# Patient Record
Sex: Female | Born: 1980 | Race: Black or African American | Hispanic: No | Marital: Married | Smoking: Current some day smoker
Health system: Southern US, Community
[De-identification: ages and names within clinical notes are randomized; demographics above are authoritative.]

## PROBLEM LIST (undated history)

## (undated) DIAGNOSIS — H409 Unspecified glaucoma: Secondary | ICD-10-CM

## (undated) DIAGNOSIS — G43909 Migraine, unspecified, not intractable, without status migrainosus: Secondary | ICD-10-CM

## (undated) DIAGNOSIS — J45909 Unspecified asthma, uncomplicated: Secondary | ICD-10-CM

## (undated) HISTORY — DX: Unspecified glaucoma: H40.9

## (undated) HISTORY — DX: Migraine, unspecified, not intractable, without status migrainosus: G43.909

## (undated) HISTORY — PX: TUBAL LIGATION: SHX77

## (undated) HISTORY — DX: Unspecified asthma, uncomplicated: J45.909

---

## 2010-05-17 ENCOUNTER — Emergency Department (HOSPITAL_COMMUNITY): Admission: EM | Admit: 2010-05-17 | Discharge: 2010-05-18 | Payer: Self-pay | Admitting: Emergency Medicine

## 2010-10-30 LAB — URINALYSIS, ROUTINE W REFLEX MICROSCOPIC
Bilirubin Urine: NEGATIVE
Glucose, UA: NEGATIVE mg/dL
Ketones, ur: NEGATIVE mg/dL
Nitrite: NEGATIVE
Protein, ur: NEGATIVE mg/dL
pH: 6 (ref 5.0–8.0)

## 2010-10-30 LAB — POCT PREGNANCY, URINE: Preg Test, Ur: NEGATIVE

## 2011-04-14 ENCOUNTER — Emergency Department (HOSPITAL_COMMUNITY)
Admission: EM | Admit: 2011-04-14 | Discharge: 2011-04-15 | Disposition: A | Discharge status: Home / Self Care | Payer: Self-pay | Attending: Emergency Medicine | Admitting: Emergency Medicine

## 2011-04-14 DIAGNOSIS — M542 Cervicalgia: Secondary | ICD-10-CM | POA: Insufficient documentation

## 2011-04-14 DIAGNOSIS — S1096XA Insect bite of unspecified part of neck, initial encounter: Secondary | ICD-10-CM | POA: Insufficient documentation

## 2011-04-14 DIAGNOSIS — W57XXXA Bitten or stung by nonvenomous insect and other nonvenomous arthropods, initial encounter: Secondary | ICD-10-CM | POA: Insufficient documentation

## 2011-04-14 DIAGNOSIS — R209 Unspecified disturbances of skin sensation: Secondary | ICD-10-CM | POA: Insufficient documentation

## 2011-11-03 ENCOUNTER — Ambulatory Visit: Payer: Self-pay

## 2011-11-10 IMAGING — CR DG HAND COMPLETE 3+V*R*
3 series · 3 of 3 positions shown · non-contrast
Comparison: None

CLINICAL DATA: Right hand laceration, abrasions, pain, trauma

RIGHT HAND - COMPLETE 3+ VIEW

[x hand ap right]
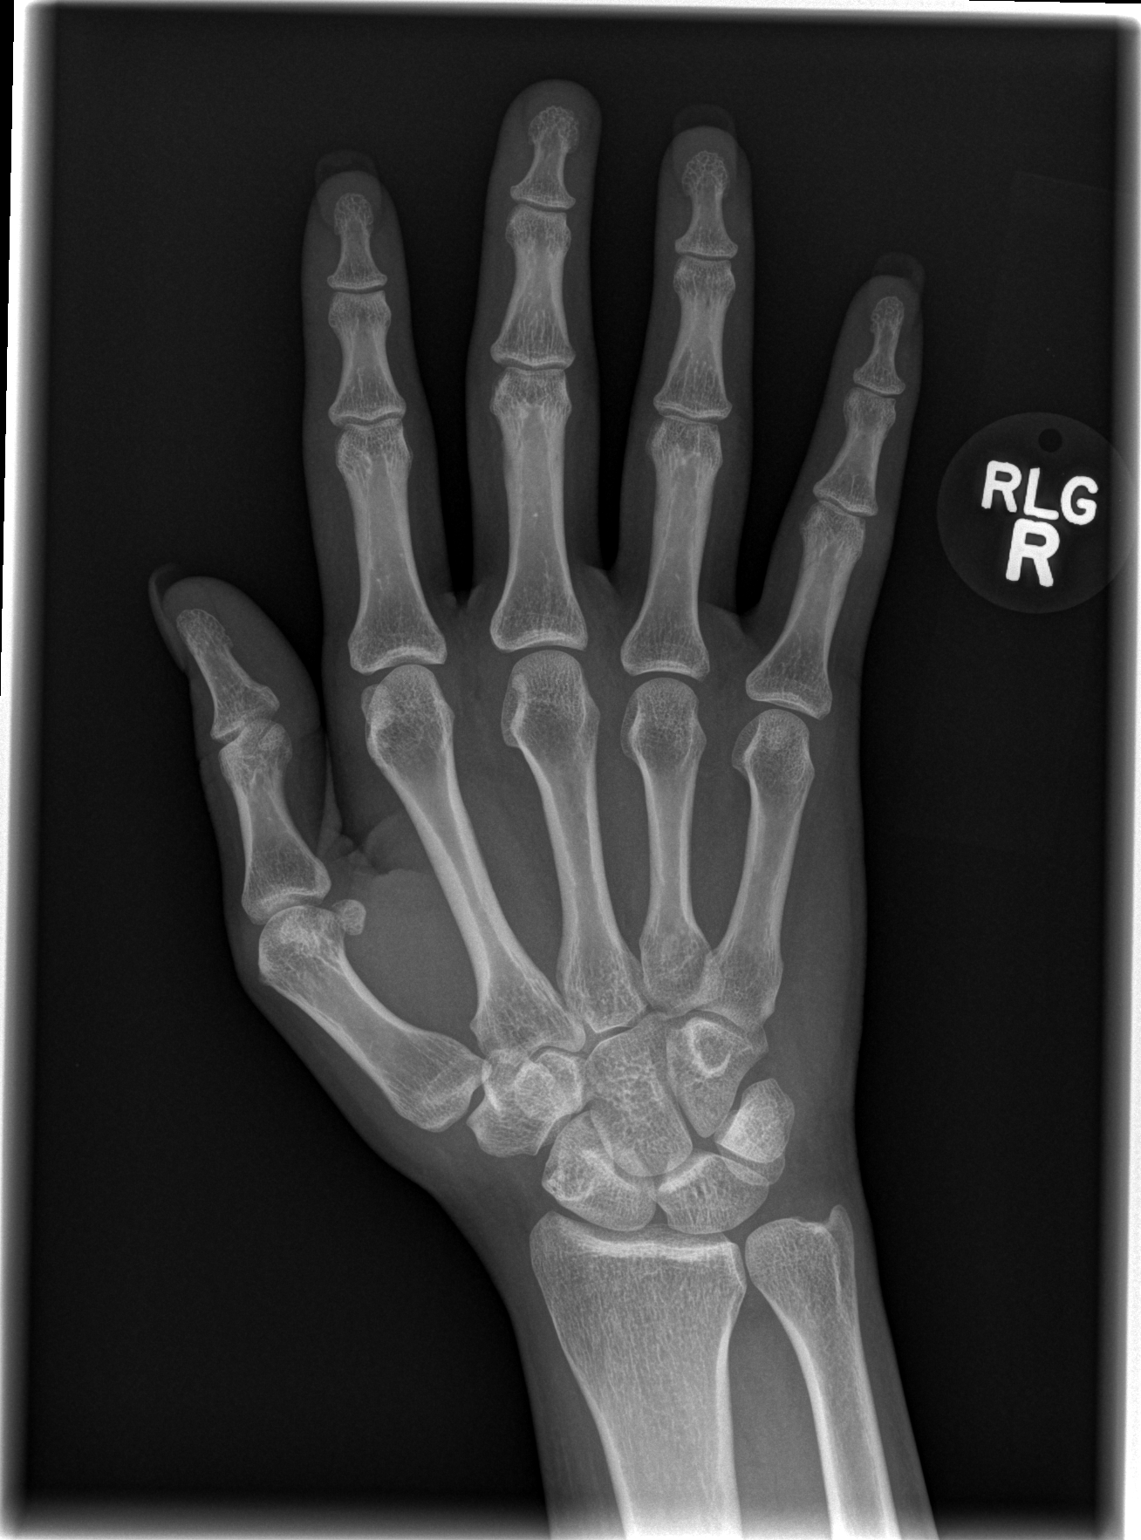

[x hand oblique right]
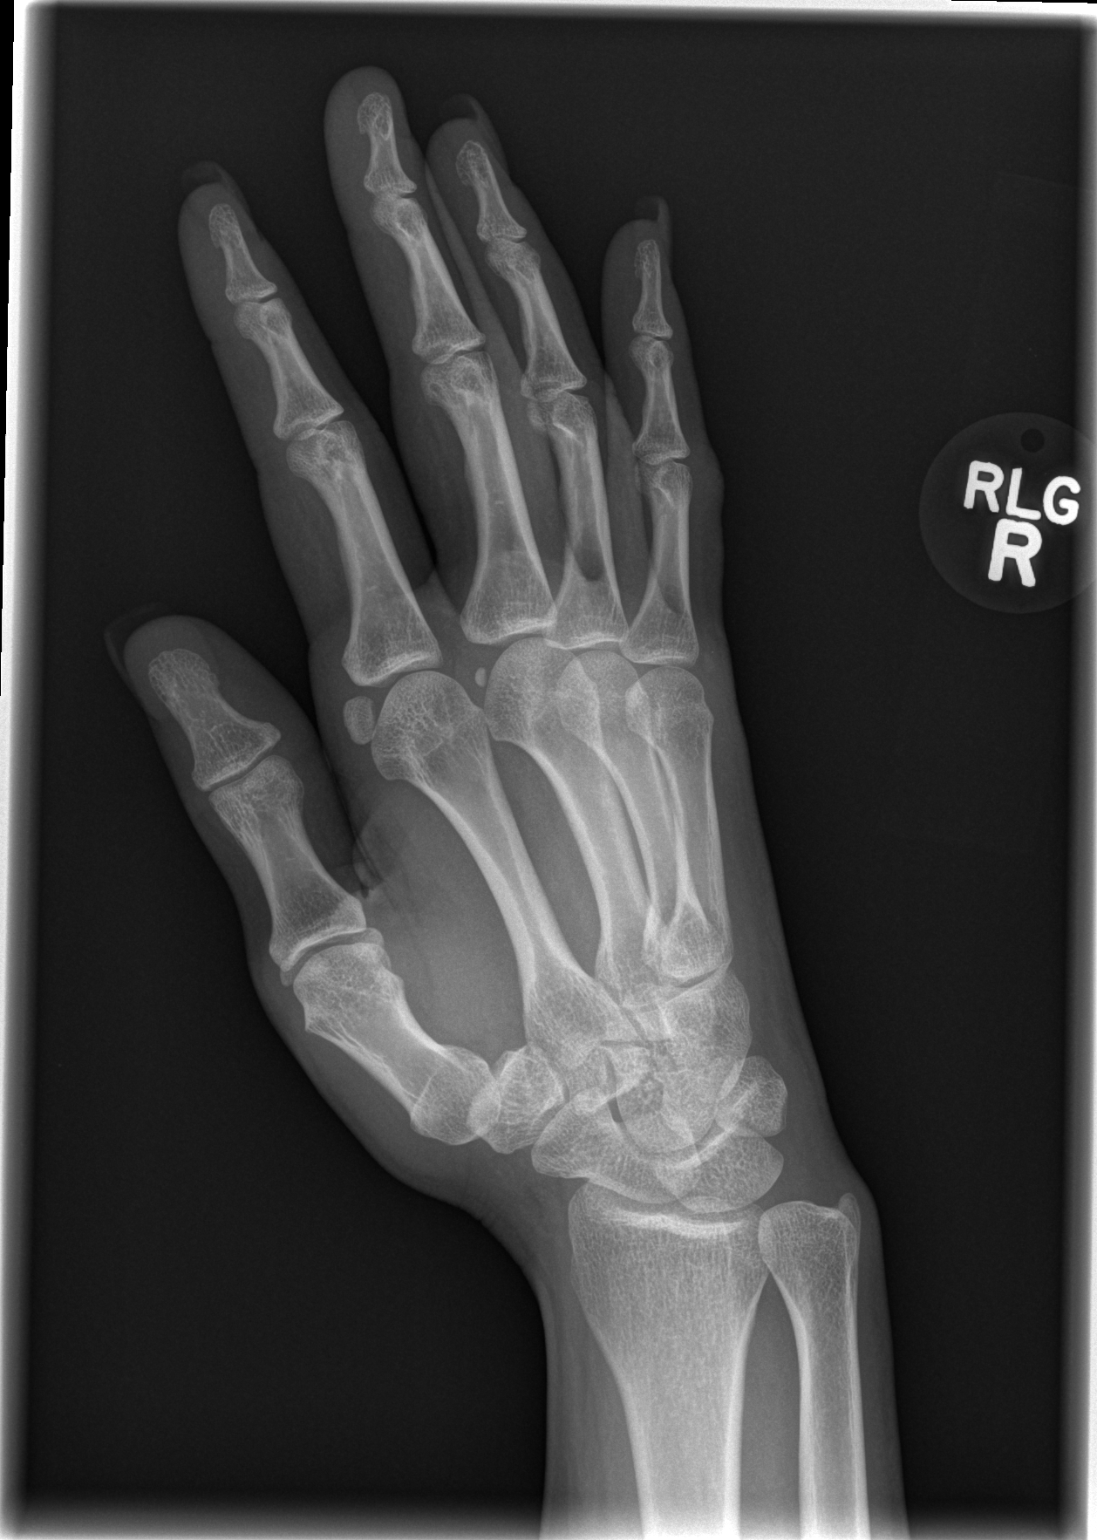

[x hand lat right]
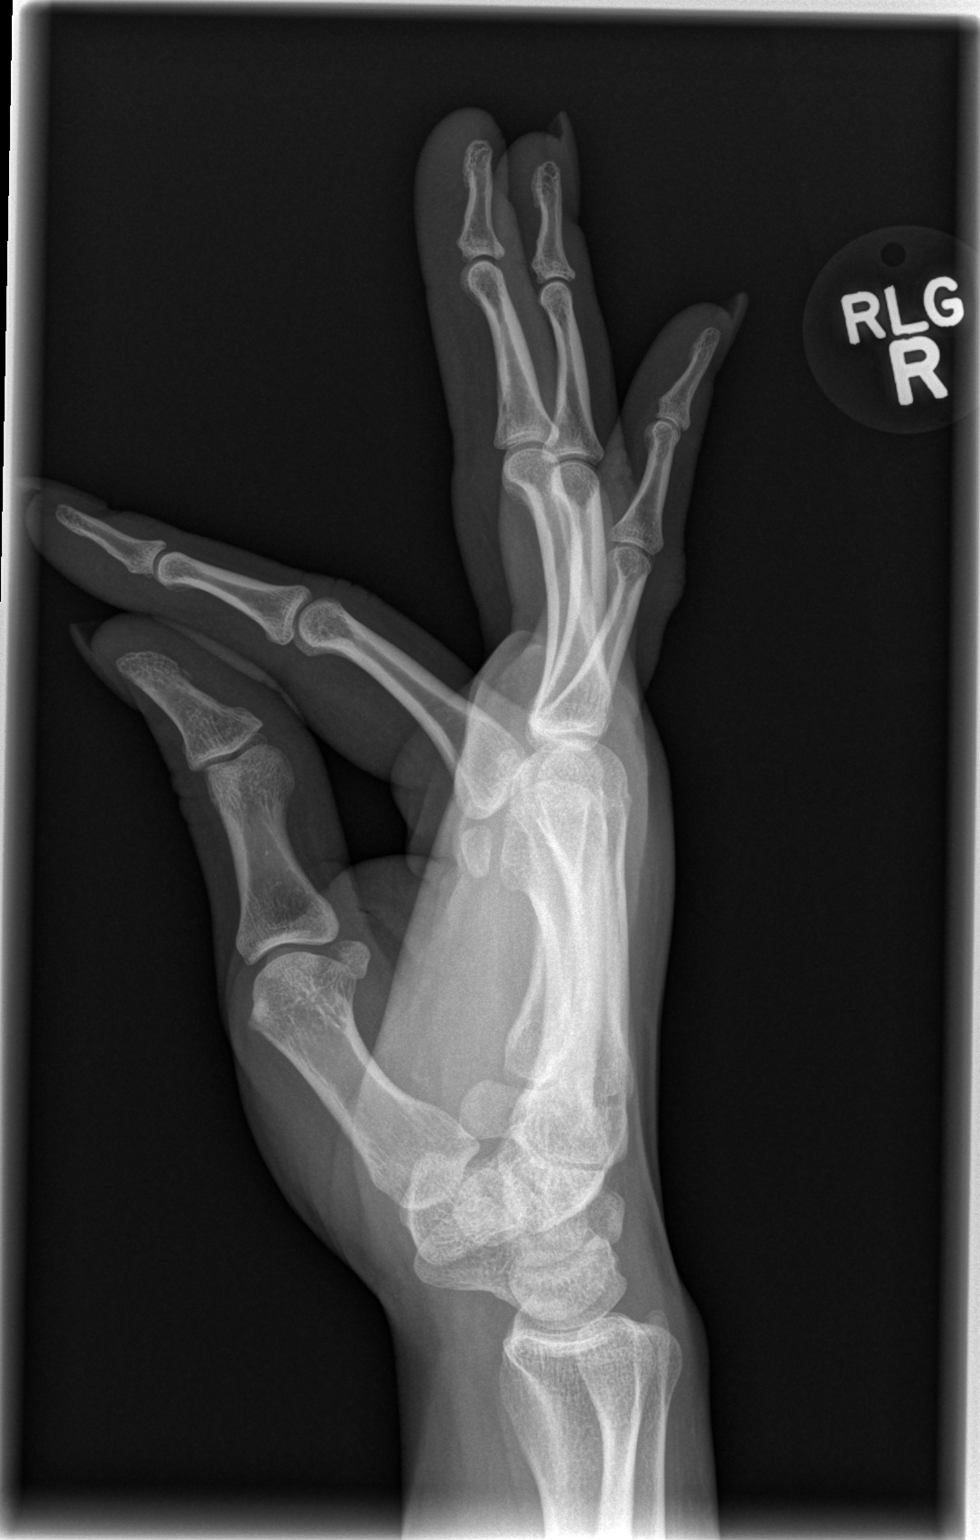

[3 of 3 positions shown; findings below may reference images not displayed]

FINDINGS: Bone mineralization normal.
Joint spaces preserved.
No fracture, dislocation, or bone destruction.
IMPRESSION: No acute osseous abnormalities.

## 2013-01-06 ENCOUNTER — Ambulatory Visit: Payer: BC Managed Care – PPO

## 2013-01-06 ENCOUNTER — Ambulatory Visit (INDEPENDENT_AMBULATORY_CARE_PROVIDER_SITE_OTHER): Payer: BC Managed Care – PPO | Admitting: Family Medicine

## 2013-01-06 VITALS — BP 118/74 | HR 86 | Temp 98.5°F | Resp 16 | Ht 70.0 in | Wt 171.0 lb

## 2013-01-06 DIAGNOSIS — S0993XA Unspecified injury of face, initial encounter: Secondary | ICD-10-CM

## 2013-01-06 DIAGNOSIS — S199XXA Unspecified injury of neck, initial encounter: Secondary | ICD-10-CM

## 2013-01-06 DIAGNOSIS — S0011XA Contusion of right eyelid and periocular area, initial encounter: Secondary | ICD-10-CM

## 2013-01-06 DIAGNOSIS — S0510XA Contusion of eyeball and orbital tissues, unspecified eye, initial encounter: Secondary | ICD-10-CM
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
## 2013-01-06 NOTE — Patient Instructions (Addendum)
If your vision gets worse or changes seek care right away.  Let us know if your pain is not getting better over the next few days

## 2013-08-27 ENCOUNTER — Emergency Department (HOSPITAL_COMMUNITY)
Admission: EM | Admit: 2013-08-27 | Discharge: 2013-08-27 | Disposition: A | Discharge status: Home / Self Care | Payer: BC Managed Care – PPO | Attending: Emergency Medicine | Admitting: Emergency Medicine

## 2013-08-27 ENCOUNTER — Encounter (HOSPITAL_COMMUNITY): Payer: Self-pay | Admitting: Emergency Medicine

## 2013-08-27 ENCOUNTER — Emergency Department (HOSPITAL_COMMUNITY): Payer: BC Managed Care – PPO

## 2013-08-27 DIAGNOSIS — S1093XA Contusion of unspecified part of neck, initial encounter: Principal | ICD-10-CM

## 2013-08-27 DIAGNOSIS — T07XXXA Unspecified multiple injuries, initial encounter: Secondary | ICD-10-CM

## 2013-08-27 DIAGNOSIS — S0003XA Contusion of scalp, initial encounter: Secondary | ICD-10-CM | POA: Insufficient documentation

## 2013-08-27 DIAGNOSIS — S40029A Contusion of unspecified upper arm, initial encounter: Secondary | ICD-10-CM | POA: Insufficient documentation

## 2013-08-27 DIAGNOSIS — F1010 Alcohol abuse, uncomplicated: Secondary | ICD-10-CM | POA: Insufficient documentation

## 2013-08-27 DIAGNOSIS — H409 Unspecified glaucoma: Secondary | ICD-10-CM | POA: Insufficient documentation

## 2013-08-27 DIAGNOSIS — J45909 Unspecified asthma, uncomplicated: Secondary | ICD-10-CM | POA: Insufficient documentation

## 2013-08-27 DIAGNOSIS — S8000XA Contusion of unspecified knee, initial encounter: Secondary | ICD-10-CM | POA: Insufficient documentation

## 2013-08-27 DIAGNOSIS — S0083XA Contusion of other part of head, initial encounter: Principal | ICD-10-CM

## 2013-08-27 DIAGNOSIS — R4182 Altered mental status, unspecified: Secondary | ICD-10-CM | POA: Insufficient documentation

## 2013-08-27 DIAGNOSIS — F17200 Nicotine dependence, unspecified, uncomplicated: Secondary | ICD-10-CM | POA: Insufficient documentation

## 2013-08-27 DIAGNOSIS — S20229A Contusion of unspecified back wall of thorax, initial encounter: Secondary | ICD-10-CM | POA: Insufficient documentation

## 2013-08-27 DIAGNOSIS — F10929 Alcohol use, unspecified with intoxication, unspecified: Secondary | ICD-10-CM

## 2013-08-27 LAB — CBC WITH DIFFERENTIAL/PLATELET
Basophils Absolute: 0 10*3/uL (ref 0.0–0.1)
Basophils Relative: 0 % (ref 0–1)
EOS PCT: 0 % (ref 0–5)
Eosinophils Absolute: 0 10*3/uL (ref 0.0–0.7)
HCT: 38.1 % (ref 36.0–46.0)
Hemoglobin: 13.3 g/dL (ref 12.0–15.0)
LYMPHS ABS: 2.4 10*3/uL (ref 0.7–4.0)
LYMPHS PCT: 30 % (ref 12–46)
MCH: 30.1 pg (ref 26.0–34.0)
MCHC: 34.9 g/dL (ref 30.0–36.0)
MCV: 86.2 fL (ref 78.0–100.0)
Monocytes Absolute: 0.3 10*3/uL (ref 0.1–1.0)
Monocytes Relative: 4 % (ref 3–12)
NEUTROS ABS: 5.4 10*3/uL (ref 1.7–7.7)
NEUTROS PCT: 66 % (ref 43–77)
PLATELETS: 246 10*3/uL (ref 150–400)
RBC: 4.42 MIL/uL (ref 3.87–5.11)
RDW: 12.5 % (ref 11.5–15.5)
WBC: 8.1 10*3/uL (ref 4.0–10.5)

## 2013-08-27 LAB — POCT I-STAT, CHEM 8
BUN: 12 mg/dL (ref 6–23)
CHLORIDE: 109 meq/L (ref 96–112)
Calcium, Ion: 1.09 mmol/L — ABNORMAL LOW (ref 1.12–1.23)
Creatinine, Ser: 1.1 mg/dL (ref 0.50–1.10)
Glucose, Bld: 122 mg/dL — ABNORMAL HIGH (ref 70–99)
HCT: 42 % (ref 36.0–46.0)
Hemoglobin: 14.3 g/dL (ref 12.0–15.0)
POTASSIUM: 3.2 meq/L — AB (ref 3.7–5.3)
SODIUM: 146 meq/L (ref 137–147)
TCO2: 19 mmol/L (ref 0–100)

## 2013-08-27 LAB — ETHANOL: ALCOHOL ETHYL (B): 280 mg/dL — AB (ref 0–11)

## 2013-08-27 MED ORDER — LORAZEPAM 2 MG/ML IJ SOLN
INTRAMUSCULAR | Status: AC
  Administered 2013-08-27: 2 mg
  Filled 2013-08-27: qty 1

## 2013-08-27 MED ORDER — ZIPRASIDONE MESYLATE 20 MG IM SOLR
20.0000 mg | Freq: Once | INTRAMUSCULAR | Status: AC
  Administered 2013-08-27: 20 mg via INTRAMUSCULAR

## 2013-08-27 MED ORDER — NAPROXEN 500 MG PO TABS: 500.0000 mg | ORAL_TABLET | Freq: Two times a day (BID) | ORAL | Status: DC
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
## 2013-08-27 NOTE — ED Notes (Signed)
Report received-resting quietly, no s/s's of distress
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
## 2013-08-27 NOTE — Discharge Instructions (Signed)
°Emergency Department Resource Guide °1) Find a Doctor and Pay Out of Pocket °Although you won't have to find out who is covered by your insurance plan, it is a good idea to ask around and get recommendations. You will then need to call the office and see if the doctor you have chosen will accept you as a new patient and what types of options they offer for patients who are self-pay. Some doctors offer discounts or will set up payment plans for their patients who do not have insurance, but you will need to ask so you aren't surprised when you get to your appointment. ° °2) Contact Your Local Health Department °Not all health departments have doctors that can see patients for sick visits, but many do, so it is worth a call to see if yours does. If you don't know where your local health department is, you can check in your phone book. The CDC also has a tool to help you locate your state's health department, and many state websites also have listings of all of their local health departments. ° °3) Find a Walk-in Clinic °If your illness is not likely to be very severe or complicated, you may want to try a walk in clinic. These are popping up all over the country in pharmacies, drugstores, and shopping centers. They're usually staffed by nurse practitioners or physician assistants that have been trained to treat common illnesses and complaints. They're usually fairly quick and inexpensive. However, if you have serious medical issues or chronic medical problems, these are probably not your best option. ° °No Primary Care Doctor: °- Call [facility] at  832-8000 - they can help you locate a primary care doctor that  accepts your insurance, provides certain services, etc. °- Physician Referral Service- 1-800-533-3463 ° °Chronic Pain Problems: °Organization         Address  Phone   Notes  °[facility]  (336) 297-2271 Patients need to be referred by their primary care doctor.  ° °Medication  Assistance: °Organization         Address  Phone   Notes  °[place] [facility] [address] °[place], [place] [address] (336) 641-8030 --Must be a resident of [place] °-- Must have NO insurance coverage whatsoever (no Medicaid/ Medicare, etc.) °-- The pt. MUST have a primary care doctor that directs their care regularly and follows them in the community °  °[facility]  (866) 331-1348   °[employer]  (888) 892-1162   ° °Agencies that provide inexpensive medical care: °Organization         Address  Phone   Notes  °[facility]  (336) 832-8035   °[facility]    (336) 832-7272   °[facility] [address] °[place], [place] [address] (336) 832-4777   °[facility] [address], °[place] (336) 271-4999   °[facility]    (336) 373-0678   °[facility]    (336) 272-1050   °[facility] ° [address], [place] Phone:  (336) 832-4444, Fax:  (336) 832-4440 Hours of Operation:  9 am - 6 pm, M-F.  Also accepts Medicaid/Medicare and self-pay.  ° [facility] ° [address], [place] Phone: (336) 832-3150, Fax: (336) 832-3151. Hours of Operation:  8:30 am - 5:30 pm, M-F.  Also accepts Medicaid and self-pay.  °[facility] [place] [address], [place] Phone: (336) 878-6027   °[facility] [address], [place], [place] (336)723-1848, Ext. 123 Mondays & Thursdays: 7-9 AM.  First 15 patients are seen on a first come, first serve basis. °  ° °Medicaid-accepting [place] Providers: ° °Organization         Address  Phone   Notes  °[facility] [address], [place] (336) 641-2100 Also accepts self-pay patients.  °[facility] [address], [place] ° (336) 856-9996   °[facility] [address], [place]  (336) 288-8857   °[facility] [address] [place] [address], [place] (336) 299-7000   °Veita Bland [address], [place]  ° (336) 373-1557 Only accepts [place] Access Medicaid patients after they have their name applied to their card.  ° °Self-Pay (no insurance) in [place]: ° °Organization         Address  Phone   Notes  °Sickle Cell Patients, [facility] [address], [place] (336) 832-1970   °[facility] [address], [place] (336) 832-4400   °[facility] [place] ° [address], [place] (336) 992-4800   °[facility]/Dr. Osei-Bonsu ° [address] [place] [address], [place] or [address], [place] (336) 841-8500 Phone number for both [place] and [place] locations is the same.  °[facility] [address], [place] (336) 299-0000   °[facility] [address] [place] [address], [place] or [address] (336) 852-7530 °(336) 878-2260   °[facility] [address], [place] (336) 350-1642, phone; (336) 294-5005, fax Sees patients 1st and 3rd Saturday of every month.  Must not qualify for public or private insurance (i.e. Medicaid, Medicare, [place] Health Choice, Veterans' Benefits) • Household income should be no more than 200% of the poverty level •The clinic cannot treat you if you are pregnant or think you are pregnant • Sexually transmitted diseases are not treated at the clinic.  ° ° °Dental Care: °Organization         Address  Phone  Notes  °[place] [facility] [address], [place] (336) 641-6152 Accepts children up to age 21 who are enrolled in Medicaid or [place] Health Choice; pregnant women with a Medicaid card; and children who have applied for Medicaid or [place] Health Choice, but were declined, whose parents can pay a reduced fee at time of service.  °[place]  [facility] [place]  [address], [place] (336) 641-7733 Accepts children up to age 21 who are enrolled in Medicaid or [place] Health Choice; pregnant women with a Medicaid card; and children who have applied for Medicaid or [place] Health Choice, but were declined, whose parents can pay a reduced fee at time of service.  °[facility] ° [address], [place] (336) 641-4533 Patients are seen by appointment only. Walk-ins are not accepted. [facility] will see patients 18 years of age and older. °Monday - Tuesday (8am-5pm) °Most Wednesdays (8:30-5pm) °$30 per visit, cash only  °[facility] ° [address], [place] (336) 641-4533 Patients are seen by appointment only. Walk-ins are not accepted. [facility] will see patients 18 years of age and older. °One   Wednesday Evening (Monthly: Volunteer Based).  $30 per visit, cash only  °[facility]  (919) 537-3737 for adults; Children under age 4, call Graduate Pediatric Dentistry at (919) [phone]. Children aged 4-14, please call (919) [phone] to request a pediatric application. ° Dental services are provided in all areas of dental care including fillings, crowns and bridges, complete and partial dentures, implants, gum treatment, root canals, and extractions. Preventive care is also provided. Treatment is provided to both adults and children. °Patients are selected via a lottery and there is often a waiting list. °  °[facility] [address], °[place] ° (336) 763-8833 [email or web address] °  °[facility] [address], [place], [place] (336)723-1848, Ext. 123 Second and Fourth Thursday of each month, opens at 6:30 AM; Clinic ends at 9 AM.  Patients are seen on a first-come first-served basis, and a limited number are seen during each clinic.  ° °[facility] ° [address], [place], [place] (336) 723-7904    Eligibility Requirements °You must have lived in [place], [place], or [place] counties for at least the last three months. °  You cannot be eligible for state or federal sponsored healthcare insurance, including Veterans Administration, Medicaid, or Medicare. °  You generally cannot be eligible for healthcare insurance through your employer.  °  How to apply: °Eligibility screenings are held every Tuesday and Wednesday afternoon from 1:00 pm until 4:00 pm. You do not need an appointment for the interview!  °[facility] [address], [place], [place] 336-631-2330   °[facility]  336-342-8273   °[facility]  336-703-3100   °[facility]  336-570-6415   ° °Behavioral Health Resources in the Community: °Intensive Outpatient Programs °Organization         Address  Phone  Notes  °[facility] [address], [place], [place] 336-878-6098   °[facility] [address], [place], [place] 336-832-9800   °[facility] [address], [place], [place] ° 336-882-2125   °[facility] [address],  °[place], [place] 1-800-853-5163 or 336-641-4981   °Substance Abuse Resources °Organization         Address  Phone  Notes  °[facility]  336-882-2125   °[facility]  336-784-9470   °[facility]  336-285-9073   °[facility]  336-845-3988   °Residential & Outpatient Substance Abuse Program  1-800-659-3381   °Psychological Services °Organization         Address  Phone  Notes  °[facility]  336- 832-9600   °[facility]  336- 378-7881   °[facility] [address], [place] 1-800-853-5163 or 336-641-4981   ° °Mobile Crisis Teams °Organization         Address  Phone  Notes  °[facility], Mobile Crisis Care Unit  1-877-626-1772   °Assertive °[facility] ° [address]  [place], [place] 336-834-9664   °Sharon DeEsch [address] °[place] 336-554-5454   ° °Self-Help/Support Groups °Organization         Address  Phone             Notes  °[facility] [place] - variety of support groups  336- 373-1402 Call for more information  °Narcotics Anonymous (NA), [facility] [address], °[place]  2 meetings at this location  ° °  Residential Treatment Programs °Organization         Address  Phone  Notes  °[facility] [address],    °[place]  1-866-801-8205   °[facility] ° [address], [place], [place] 704-293-8524   °[facility] [address], [place] 336-845-3988 Admissions: 8am-3pm M-F  °[facility] [address]    °[place], [place] 336-841-1104   °[facility] [address], [place], [place] 336-379-7146   °[facility] [address]  °[place], [place] 336-285-9073   °[facility] - Intensive Outpatient [address], [place], [place] 336-852-3033   °[facility] [address]  °[place], [place] 1-877-615-2722 or 336-784-9470   °[facility] [address] [place], [place] 336-227-7417 Accepts Medicaid  °[facility] [address]  °[place] 1-800-659-3381 Substance Abuse/Addiction Treatment  ° °[place] Behavioral Health Resources °Organization         Address  Phone  Notes  °[facility]  (888) 581-9988   °Julie Brannon, PhD [address] [place], [place]   (336) 349-5553 or (336) 951-0000   °[facility]   [address] °[place], [place] (336) 349-4454   °[facility] [address], [place], [place] (336) 342-8316 Insurance/Medicaid/sponsorship through [facility]  °[facility] [address]                                    [place], [place] (336) 342-8316 Therapy/tele-psych/case    °[facility] [address]  ° [place], [place] (336) 349-2233    °Dr. Arfeen  (336) 349-4544   °[facility] [place]  [employer] [place] [facility] 1) [address], [place] °2) [address], [place] °3)  [address], [place] (336) 349-3220 °(336) 342-7768 ° °(336) 342-8140   °[place] [facility] (336) 342-1394 or (336) 342-3537 (After Hours)    ° ° °
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
## 2014-05-21 ENCOUNTER — Encounter (HOSPITAL_COMMUNITY): Payer: Self-pay | Admitting: Emergency Medicine

## 2014-05-21 ENCOUNTER — Emergency Department (HOSPITAL_COMMUNITY)
Admission: EM | Admit: 2014-05-21 | Discharge: 2014-05-21 | Disposition: A | Discharge status: Home / Self Care | Payer: BC Managed Care – PPO | Attending: Emergency Medicine | Admitting: Emergency Medicine

## 2014-05-21 DIAGNOSIS — Z8669 Personal history of other diseases of the nervous system and sense organs: Secondary | ICD-10-CM | POA: Diagnosis not present

## 2014-05-21 DIAGNOSIS — Z720 Tobacco use: Secondary | ICD-10-CM | POA: Diagnosis not present

## 2014-05-21 DIAGNOSIS — Z791 Long term (current) use of non-steroidal anti-inflammatories (NSAID): Secondary | ICD-10-CM | POA: Insufficient documentation

## 2014-05-21 DIAGNOSIS — M545 Low back pain: Secondary | ICD-10-CM | POA: Diagnosis not present

## 2014-05-21 DIAGNOSIS — M549 Dorsalgia, unspecified: Secondary | ICD-10-CM

## 2014-05-21 DIAGNOSIS — J45909 Unspecified asthma, uncomplicated: Secondary | ICD-10-CM | POA: Diagnosis not present

## 2014-05-21 MED ORDER — OXYCODONE-ACETAMINOPHEN 5-325 MG PO TABS: 1.0000 | ORAL_TABLET | ORAL | Status: DC | PRN

## 2014-05-21 MED ORDER — METHOCARBAMOL 500 MG PO TABS: 500.0000 mg | ORAL_TABLET | Freq: Two times a day (BID) | ORAL | Status: DC

## 2014-05-21 MED ORDER — OXYCODONE-ACETAMINOPHEN 5-325 MG PO TABS
1.0000 | ORAL_TABLET | Freq: Once | ORAL | Status: AC
  Administered 2014-05-21: 1 via ORAL
  Filled 2014-05-21: qty 1

## 2014-05-21 MED ORDER — METHOCARBAMOL 500 MG PO TABS
500.0000 mg | ORAL_TABLET | Freq: Once | ORAL | Status: AC
  Administered 2014-05-21: 500 mg via ORAL
  Filled 2014-05-21: qty 1

## 2014-05-21 NOTE — Discharge Instructions (Signed)
Take the prescribed medication as directed.  Use caution when driving, percocet may make you drowsy. May wish to apply heating pad to affected areas to help with pain/loosen muscles. Follow-up with your primary care physician for re-check later this week. Return to the ED for new or worsening symptoms.

## 2014-05-21 NOTE — ED Provider Notes (Signed)
CSN: [id number]     Arrival date & time 05/21/14  1058 History   First MD Initiated Contact with Patient 05/21/14 1144     Chief Complaint  Patient presents with  . Back Pain     (Consider location/radiation/quality/duration/timing/severity/associated sxs/prior Treatment) The history is provided by the patient and medical records.   This is a 33 y.o. F with PMH significant for glaucoma and asthma, presenting to the ED for back pain.  Patient states this morning she got out of her car and was adjusting her dress when she developed sudden onset of intermittent and sharp pain in her low back. She denies any radiation to her extremities.  No numbness, paresthesias or weakness of extremities.  No loss of bowel or bladder control.  No prior hx of back injuries or surgeries.  Patient states she turned the heated seats on in her husbands car and that has helped her pain.  VS stable on arrival.  Past Medical History  Diagnosis Date  . Glaucoma   . Asthma    History reviewed. No pertinent past surgical history. No family history on file. History  Substance Use Topics  . Smoking status: Current Some Day Smoker  . Smokeless tobacco: Not on file  . Alcohol Use: Yes   OB History   Grav Para Term Preterm Abortions TAB SAB Ect Mult Living                 Review of Systems  Musculoskeletal: Positive for back pain.  All other systems reviewed and are negative.     Allergies  Review of patient's allergies indicates no known allergies.  Home Medications   Prior to Admission medications   Medication Sig Start Date End Date Taking? Authorizing Provider  Homeopathic Products ([facility]ICARE [facility]NICA) CREA Apply topically.    Historical Provider, MD  naproxen (NAPROSYN) 500 MG tablet Take 1 tablet (500 mg total) by mouth 2 (two) times daily with a meal. 08/27/13   Vida RollerBrian D Miller, MD   BP 114/69  Pulse 82  Temp(Src) 98.1 F (36.7 C) (Oral)  Resp 16  SpO2 100%  LMP 05/01/2014  Physical Exam    Nursing note and vitals reviewed. Constitutional: She is oriented to person, place, and time. She appears well-developed and well-nourished.  HENT:  Head: Normocephalic and atraumatic.  Mouth/Throat: Oropharynx is clear and moist.  Eyes: Conjunctivae and EOM are normal. Pupils are equal, round, and reactive to light.  Neck: Normal range of motion.  Cardiovascular: Normal rate, regular rhythm and normal heart sounds.   Pulmonary/Chest: Effort normal and breath sounds normal. No respiratory distress. She has no wheezes.  Musculoskeletal: Normal range of motion.       Lumbar back: She exhibits tenderness, pain and spasm.       Back:  Tenderness, pain, and spasm of lumbar paraspinal muscle bilaterally; full ROM maintained but with some pain; normal strength and sensation BLE; legs NVI  Neurological: She is alert and oriented to person, place, and time.  Skin: Skin is warm and dry.  Psychiatric: She has a normal mood and affect.    ED Course  Procedures (including critical care time) Labs Review Labs Reviewed - No data to display  Imaging Review No results found.   EKG Interpretation None      MDM   Final diagnoses:  Back pain, unspecified location   Back pain after bending over to adjust dress after getting out of car.  No red flag sx or focal neurologic  deficits on exam.  Sx likely due to muscle spasm.  Low suspicion for vertebral fx or subluxation given no recent trauma, imaging deferred at this time-- patient in agreement with this.  Will start on course of pain meds and muscle relaxer.  Encouraged heat therapy as well.  Patient will FU with PCP this week for re-check.  Discussed plan with patient, he/she acknowledged understanding and agreed with plan of care.  Return precautions given for new or worsening symptoms.  Garlon Hatchet, PA-C 05/21/14 1251

## 2014-05-21 NOTE — ED Notes (Signed)
Pt reports when she got out of her car at work today and reached to pull down her dress she felt a sharp pain in her lower/middle back and has hurt ever since. Pt denies any bladder or bowel problems.

## 2014-05-22 NOTE — ED Provider Notes (Signed)
Medical screening examination/treatment/procedure(s) were performed by non-physician practitioner and as supervising physician I was immediately available for consultation/collaboration.   Christopher J. Pollina, MD 05/22/14 0716 

## 2015-08-18 HISTORY — PX: WRIST FRACTURE SURGERY: SHX121

## 2019-05-17 ENCOUNTER — Other Ambulatory Visit: Payer: Self-pay

## 2019-05-17 ENCOUNTER — Ambulatory Visit (INDEPENDENT_AMBULATORY_CARE_PROVIDER_SITE_OTHER): Payer: Medicaid Other | Admitting: Allergy and Immunology

## 2019-05-17 ENCOUNTER — Encounter: Payer: Self-pay | Admitting: Allergy and Immunology

## 2019-05-17 VITALS — BP 130/90 | HR 87 | Temp 98.7°F | Resp 16 | Ht 69.0 in | Wt 205.5 lb

## 2019-05-17 DIAGNOSIS — H1010 Acute atopic conjunctivitis, unspecified eye: Secondary | ICD-10-CM | POA: Insufficient documentation

## 2019-05-17 DIAGNOSIS — T7840XD Allergy, unspecified, subsequent encounter: Secondary | ICD-10-CM | POA: Diagnosis not present

## 2019-05-17 DIAGNOSIS — J4520 Mild intermittent asthma, uncomplicated: Secondary | ICD-10-CM

## 2019-05-17 DIAGNOSIS — W57XXXA Bitten or stung by nonvenomous insect and other nonvenomous arthropods, initial encounter: Secondary | ICD-10-CM | POA: Insufficient documentation

## 2019-05-17 DIAGNOSIS — T7800XD Anaphylactic reaction due to unspecified food, subsequent encounter: Secondary | ICD-10-CM | POA: Diagnosis not present

## 2019-05-17 DIAGNOSIS — J3089 Other allergic rhinitis: Secondary | ICD-10-CM | POA: Diagnosis not present

## 2019-05-17 DIAGNOSIS — T7800XA Anaphylactic reaction due to unspecified food, initial encounter: Secondary | ICD-10-CM | POA: Insufficient documentation

## 2019-05-17 DIAGNOSIS — T7840XA Allergy, unspecified, initial encounter: Secondary | ICD-10-CM | POA: Insufficient documentation

## 2019-05-17 DIAGNOSIS — S40262S Insect bite (nonvenomous) of left shoulder, sequela: Secondary | ICD-10-CM

## 2019-05-17 DIAGNOSIS — W57XXXS Bitten or stung by nonvenomous insect and other nonvenomous arthropods, sequela: Secondary | ICD-10-CM

## 2019-05-17 DIAGNOSIS — H1013 Acute atopic conjunctivitis, bilateral: Secondary | ICD-10-CM

## 2019-05-17 MED ORDER — EPINEPHRINE 0.3 MG/0.3ML IJ SOAJ: INTRAMUSCULAR | 1 refills | Status: AC

## 2019-05-17 MED ORDER — PAZEO 0.7 % OP SOLN: 1.0000 [drp] | OPHTHALMIC | 5 refills | Status: DC | PRN

## 2019-05-17 MED ORDER — LEVOCETIRIZINE DIHYDROCHLORIDE 5 MG PO TABS: ORAL_TABLET | ORAL | 5 refills | Status: DC

## 2019-05-17 MED ORDER — AZELASTINE HCL 0.1 % NA SOLN: NASAL | 5 refills | Status: DC

## 2019-05-17 NOTE — Assessment & Plan Note (Signed)
The patient's history suggests insect or arachnid bite/sting.  No insect or arachnid was identified at the time of the bite/sting.  A laboratory order form has been provided for baseline serum tryptase level and serum specific IgE against fire ant and hymenoptera venom panel.  For now, attempt to avoid biting/stinging insects and have access to epinephrine autoinjectors in case of systemic reaction.  A prescription has been provided for epinephrine 0.3 mg autoinjector (EpiPen) 2 pack along with instructions for its proper administration.

## 2019-05-17 NOTE — Progress Notes (Signed)
New Patient Note  RE: Sara Mendoza MRN: [id number] DOB: 10/14/1980 Date of Office Visit: 05/17/2019  Referring provider: Alm Bustardorn, Henry H, MD Primary care provider: Alm Bustardorn, Henry H, MD  Chief Complaint: Allergic Rhinitis , Asthma, Food Intolerance, and Insect Bite  History of present illness: Sara Mendoza is a 38 y.o. female seen today in consultation requested by Arther AbbottHenry Dorn, MD. She reports that approximately 2 months ago, while camping, she was "bitten by something" on the right forearm in the middle the night.  She reports that it "stung like a fire ant" and she brushed it away before being able to see what had bitten/stung her.  Her entire right forearm swelled, as well as part of the upper arm, and became "red and hot."  She did not experience generalized urticaria, cardiopulmonary symptoms, or GI symptoms.  She did not notice a pustule or residual stinger.  She went to see her primary care physician and was started on a prednisone taper.  Sara Mendoza reports that when she is bitten by mosquitoes she develops large local reactions.  She does not experience other systemic symptoms. She reports that when she consumes fresh peaches she experiences lip irritation.  She also states that when grapefruit juice gets on her skin she develops "tiny, tiny, tiny bumps" anywhere the juice touch the skin.  She does not seem to experience symptoms in her mouth if she takes a bite of grapefruit, however her lips "get dry and scaly and peel almost like a chemical burn."  She does not experience concomitant cardiopulmonary or GI symptoms. Sara Mendoza experiences frequent nasal congestion, rhinorrhea, sneezing, nasal pruritus, ocular pruritus, and postnasal drainage.  These symptoms occur year-round but are more frequent and severe throughout the summer.  Specific triggers include exposure to pollen, dust, and strong aromas.  She attempts to control the symptoms with loratadine without adequate symptom  relief. She has a history of asthma and carries albuterol for occasional lower respiratory symptoms.  Assessment and plan: Allergic reaction The patient's history suggests insect or arachnid bite/sting.  No insect or arachnid was identified at the time of the bite/sting.  A laboratory order form has been provided for baseline serum tryptase level and serum specific IgE against fire ant and hymenoptera venom panel.  For now, attempt to avoid biting/stinging insects and have access to epinephrine autoinjectors in case of systemic reaction.  A prescription has been provided for epinephrine 0.3 mg autoinjector (EpiPen) 2 pack along with instructions for its proper administration.  Food allergy The patient's history suggests food  allergy and positive skin test results today confirm this diagnosis.  Food allergen skin tests today revealed reactivity to peach which correlates with her history.  Grapefruit is not part of our food allergen panel, therefore we will investigate further with labs.  A laboratory order form has been provided for serum specific IgE against grapefruit.  Continue avoidance of peach and grapefruit.  Have access to epinephrine auto-injector 2 pack in case of accidental ingestion.  A food allergy action plan has been provided and discussed.  Medic Alert identification is recommended.  Seasonal and perennial allergic rhinitis  Aeroallergen avoidance measures have been discussed and provided in written form.  A prescription has been provided for levocetirizine(Xyzal), 5 mg daily as needed.  A prescription has been provided for azelastine nasal spray, 1-2 sprays per nostril twice daily as needed. Proper nasal spray technique has been discussed and demonstrated.  Nasal saline spray (i.e., Simply Saline) or nasal saline lavage (i.e.,  NeilMed) is recommended as needed and prior to medicated nasal sprays.  If allergen avoidance measures and medications fail to adequately  relieve symptoms, aeroallergen immunotherapy will be considered.  Allergic conjunctivitis  Treatment plan as outlined above for allergic rhinitis.  A prescription has been provided for Pazeo, one drop per eye daily as needed.  I have also recommended eye lubricant drops (i.e., Natural Tears) as needed.  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   Information regarding Skeeter Syndrome has been discussed.  Recommedations have been provided regarding mosquito avoidance and early treatment with ice, antihistamines, topical corticosteroids and antiinflammatories.  Mild intermittent asthma  Continue albuterol HFA, 1 to 2 inhalations every 4-6 hours if needed.  Subjective and objective measures of pulmonary function will be followed and the treatment plan will be adjusted accordingly.   Meds ordered this encounter  Medications  . EPINEPHrine (AUVI-Q) 0.3 mg/0.3 mL IJ SOAJ injection    Sig: Use as directed for severe allergic reactions    Dispense:  2 each    Refill:  1  . levocetirizine (XYZAL) 5 MG tablet    Sig: Take 1 tablet once daily if needed for runny nose    Dispense:  34 tablet    Refill:  5  . azelastine (ASTELIN) 0.1 % nasal spray    Sig: 1-2 sprays per nostril twice daily as needed.    Dispense:  30 mL    Refill:  5  . Olopatadine HCl (PAZEO) 0.7 % SOLN    Sig: Place 1 drop into both eyes as needed (for itchy eyes).    Dispense:  2.5 mL    Refill:  5    Diagnostics: Spirometry: Normal with an FEV1 of 94% predicted. This study was performed while the patient was asymptomatic.  Please see scanned spirometry results for details. Environmental skin testing: Positive to grass pollen, molds, cat hair, cockroach antigen, and dust mite antigen. Food allergen skin testing: Positive to peach.  Physical examination: Blood pressure 130/90, pulse 87, temperature 98.7 F (37.1 C), temperature source Oral, resp. rate 16, height  (1.753 m), weight 205 lb  7.5 oz (93.2 kg), SpO2 98 %.  General: Alert, interactive, in no acute distress. HEENT: TMs pearly gray, turbinates moderately edematous without discharge, post-pharynx mildly erythematous. Neck: Supple without lymphadenopathy. Lungs: Clear to auscultation without wheezing, rhonchi or rales. CV: Normal S1, S2 without murmurs. Abdomen: Nondistended, nontender. Skin: Warm and dry, without lesions or rashes. Extremities:  No clubbing, cyanosis or edema. Neuro:   Grossly intact.  Review of systems:  Review of systems negative except as noted in HPI / PMHx or noted below: Review of Systems  Constitutional: Negative.   HENT: Negative.   Eyes: Negative.   Respiratory: Negative.   Cardiovascular: Negative.   Gastrointestinal: Negative.   Genitourinary: Negative.   Musculoskeletal: Negative.   Skin: Negative.   Neurological: Negative.   Endo/Heme/Allergies: Negative.   Psychiatric/Behavioral: Negative.     Past medical history:  Past Medical History:  Diagnosis Date  . Asthma   . Glaucoma   . Migraine headache     Past surgical history:  Past Surgical History:  Procedure Laterality Date  . TUBAL LIGATION    . WRIST FRACTURE SURGERY Right 2017   plates and screws    Family history: Family History  Problem Relation Age of Onset  . Allergic rhinitis Mother   . Asthma Mother   . Migraines Daughter   . Asthma Daughter   . Migraines Son   .  Asthma Son   . Asthma Maternal Grandmother   . Lupus Maternal Aunt   . Asthma Paternal Aunt   . Lupus Paternal Aunt   . Angioedema Neg Hx   . Eczema Neg Hx   . Immunodeficiency Neg Hx     Social history: Social History   Socioeconomic History  . Marital status: Married    Spouse name: Not on file  . Number of children: Not on file  . Years of education: Not on file  . Highest education level: Not on file  Occupational History  . Not on file  Social Needs  . Financial resource strain: Not on file  . Food insecurity     Worry: Not on file    Inability: Not on file  . Transportation needs    Medical: Not on file    Non-medical: Not on file  Tobacco Use  . Smoking status: Current Some Day Smoker  . Smokeless tobacco: Never Used  Substance and Sexual Activity  . Alcohol use: Yes  . Drug use: No  . Sexual activity: Yes  Lifestyle  . Physical activity    Days per week: Not on file    Minutes per session: Not on file  . Stress: Not on file  Relationships  . Social Herbalist on phone: Not on file    Gets together: Not on file    Attends religious service: Not on file    Active member of club or organization: Not on file    Attends meetings of clubs or organizations: Not on file    Relationship status: Not on file  . Intimate partner violence    Fear of current or ex partner: Not on file    Emotionally abused: Not on file    Physically abused: Not on file    Forced sexual activity: Not on file  Other Topics Concern  . Not on file  Social History Narrative  . Not on file    Environmental History: The patient lives in a house with hardwood floors throughout, gas heat, and central air conditioning.  There are 3 cats, a dog, a Chinchilla, and 2 rats in the home, the dog and cat have access to her room.  There is no known mold/water damage in the home.  She has been a social smoker over the past 2 years.  Current Outpatient Medications  Medication Sig Dispense Refill  . acetaminophen (TYLENOL) 500 MG tablet Take by mouth.    Marland Kitchen albuterol (VENTOLIN HFA) 108 (90 Base) MCG/ACT inhaler INHALE 2 PUFFS Q 4 H PRN    . FLUoxetine (PROZAC) 10 MG capsule Prozac 10 mg capsule  Take 1 capsule every day by oral route in the morning.    Marland Kitchen ibuprofen (ADVIL) 800 MG tablet     . azelastine (ASTELIN) 0.1 % nasal spray 1-2 sprays per nostril twice daily as needed. 30 mL 5  . EPINEPHrine (AUVI-Q) 0.3 mg/0.3 mL IJ SOAJ injection Use as directed for severe allergic reactions 2 each 1  . levocetirizine (XYZAL)  5 MG tablet Take 1 tablet once daily if needed for runny nose 34 tablet 5  . Olopatadine HCl (PAZEO) 0.7 % SOLN Place 1 drop into both eyes as needed (for itchy eyes). 2.5 mL 5   No current facility-administered medications for this visit.     Known medication allergies: Allergies  Allergen Reactions  . Venomil Mixed Vespid [Mixed Vespid Venom]     I appreciate the opportunity  to take part in Amirrah's care. Please do not hesitate to contact me with questions.  Sincerely,   R. Jorene Guest, MD

## 2019-05-17 NOTE — Assessment & Plan Note (Signed)
The history suggests Skeeter Syndrome.   Information regarding Skeeter Syndrome has been discussed.  Recommedations have been provided regarding mosquito avoidance and early treatment with ice, antihistamines, topical corticosteroids and antiinflammatories.

## 2019-05-17 NOTE — Assessment & Plan Note (Signed)
The patient's history suggests food  allergy and positive skin test results today confirm this diagnosis.  Food allergen skin tests today revealed reactivity to peach which correlates with her history.  Grapefruit is not part of our food allergen panel, therefore we will investigate further with labs.  A laboratory order form has been provided for serum specific IgE against grapefruit.  Continue avoidance of peach and grapefruit.  Have access to epinephrine auto-injector 2 pack in case of accidental ingestion.  A food allergy action plan has been provided and discussed.  Medic Alert identification is recommended.

## 2019-05-17 NOTE — Patient Instructions (Addendum)
Allergic reaction The patient's history suggests insect or arachnid bite/sting.  No insect or arachnid was identified at the time of the bite/sting.  A laboratory order form has been provided for baseline serum tryptase level and serum specific IgE against fire ant and hymenoptera venom panel.  For now, attempt to avoid biting/stinging insects and have access to epinephrine autoinjectors in case of systemic reaction.  A prescription has been provided for epinephrine 0.3 mg autoinjector (EpiPen) 2 pack along with instructions for its proper administration.  Food allergy The patient's history suggests food  allergy and positive skin test results today confirm this diagnosis.  Food allergen skin tests today revealed reactivity to peach which correlates with her history.  Grapefruit is not part of our food allergen panel, therefore we will investigate further with labs.  A laboratory order form has been provided for serum specific IgE against grapefruit.  Continue avoidance of peach and grapefruit.  Have access to epinephrine auto-injector 2 pack in case of accidental ingestion.  A food allergy action plan has been provided and discussed.  Medic Alert identification is recommended.  Seasonal and perennial allergic rhinitis  Aeroallergen avoidance measures have been discussed and provided in written form.  A prescription has been provided for levocetirizine(Xyzal), 5 mg daily as needed.  A prescription has been provided for azelastine nasal spray, 1-2 sprays per nostril twice daily as needed. Proper nasal spray technique has been discussed and demonstrated.  Nasal saline spray (i.e., Simply Saline) or nasal saline lavage (i.e., NeilMed) is recommended as needed and prior to medicated nasal sprays.  If allergen avoidance measures and medications fail to adequately relieve symptoms, aeroallergen immunotherapy will be considered.  Allergic conjunctivitis  Treatment plan as outlined above  for allergic rhinitis.  A prescription has been provided for Pazeo, one drop per eye daily as needed.  I have also recommended eye lubricant drops (i.e., Natural Tears) as needed.  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   Information regarding Skeeter Syndrome has been discussed.  Recommedations have been provided regarding mosquito avoidance and early treatment with ice, antihistamines, topical corticosteroids and antiinflammatories.  Mild intermittent asthma  Continue albuterol HFA, 1 to 2 inhalations every 4-6 hours if needed.  Subjective and objective measures of pulmonary function will be followed and the treatment plan will be adjusted accordingly.   When lab results have returned the patient will be called with further recommendations.  Control of Dust Mite Allergen  House dust mites play a major role in allergic asthma and rhinitis.  They occur in environments with high humidity wherever human skin, the food for dust mites is found. High levels have been detected in dust obtained from mattresses, pillows, carpets, upholstered furniture, bed covers, clothes and soft toys.  The principal allergen of the house dust mite is found in its feces.  A gram of dust may contain 1,000 mites and 250,000 fecal particles.  Mite antigen is easily measured in the air during house cleaning activities.    1. Encase mattresses, including the box spring, and pillow, in an air tight cover.  Seal the zipper end of the encased mattresses with wide adhesive tape. 2. Wash the bedding in water of 130 degrees Farenheit weekly.  Avoid cotton comforters/quilts and flannel bedding: the most ideal bed covering is the dacron comforter. 3. Remove all upholstered furniture from the bedroom. 4. Remove carpets, carpet padding, rugs, and non-washable window drapes from the bedroom.  Wash drapes weekly or use plastic window coverings. 5. Remove  all non-washable stuffed toys from the bedroom.  Wash  stuffed toys weekly. 6. Have the room cleaned frequently with a vacuum cleaner and a damp dust-mop.  The patient should not be in a room which is being cleaned and should wait 1 hour after cleaning before going into the room. 7. Close and seal all heating outlets in the bedroom.  Otherwise, the room will become filled with dust-laden air.  An electric heater can be used to heat the room. Reduce indoor humidity to less than 50%.  Do not use a humidifier.   Reducing Pollen Exposure  The American Academy of Allergy, Asthma and Immunology suggests the following steps to reduce your exposure to pollen during allergy seasons.    1. Do not hang sheets or clothing out to dry; pollen may collect on these items. 2. Do not mow lawns or spend time around freshly cut grass; mowing stirs up pollen. 3. Keep windows closed at night.  Keep car windows closed while driving. 4. Minimize morning activities outdoors, a time when pollen counts are usually at their highest. 5. Stay indoors as much as possible when pollen counts or humidity is high and on windy days when pollen tends to remain in the air longer. 6. Use air conditioning when possible.  Many air conditioners have filters that trap the pollen spores. 7. Use a HEPA room air filter to remove pollen form the indoor air you breathe.   Control of Dog or Cat Allergen  Avoidance is the best way to manage a dog or cat allergy. If you have a dog or cat and are allergic to dog or cats, consider removing the dog or cat from the home. If you have a dog or cat but don't want to find it a new home, or if your family wants a pet even though someone in the household is allergic, here are some strategies that may help keep symptoms at bay:  1. Keep the pet out of your bedroom and restrict it to only a few rooms. Be advised that keeping the dog or cat in only one room will not limit the allergens to that room. 2. Don't pet, hug or kiss the dog or cat; if you do, wash  your hands with soap and water. 3. High-efficiency particulate air (HEPA) cleaners run continuously in a bedroom or living room can reduce allergen levels over time. 4. Place electrostatic material sheet in the air inlet vent in the bedroom. 5. Regular use of a high-efficiency vacuum cleaner or a central vacuum can reduce allergen levels. 6. Giving your dog or cat a bath at least once a week can reduce airborne allergen.   Control of Mold Allergen  Mold and fungi can grow on a variety of surfaces provided certain temperature and moisture conditions exist.  Outdoor molds grow on plants, decaying vegetation and soil.  The major outdoor mold, Alternaria and Cladosporium, are found in very high numbers during hot and dry conditions.  Generally, a late Summer - Fall peak is seen for common outdoor fungal spores.  Rain will temporarily lower outdoor mold spore count, but counts rise rapidly when the rainy period ends.  The most important indoor molds are Aspergillus and Penicillium.  Dark, humid and poorly ventilated basements are ideal sites for mold growth.  The next most common sites of mold growth are the bathroom and the kitchen.  Outdoor MicrosoftMold Control 1. Use air conditioning and keep windows closed 2. Avoid exposure to decaying vegetation. 3. Avoid  leaf raking. 4. Avoid grain handling. 5. Consider wearing a face mask if working in moldy areas.  Indoor Mold Control 1. Maintain humidity below 50%. 2. Clean washable surfaces with 5% bleach solution. 3. Remove sources e.g. Contaminated carpets.   Control of Cockroach Allergen  Cockroach allergen has been identified as an important cause of acute attacks of asthma, especially in urban settings.  There are fifty-five species of cockroach that exist in the [place], however only three, the [place], [place] species produce allergen that can affect patients with Asthma.  Allergens can be obtained from fecal particles, egg casings  and secretions from cockroaches.    1. Remove food sources. 2. Reduce access to water. 3. Seal access and entry points. 4. Spray runways with 0.5-1% Diazinon or Chlorpyrifos 5. Blow boric acid power under stoves and refrigerator. 6. Place bait stations (hydramethylnon) at feeding sites.    Skeeter Syndrome Treatment   Mosquito avoidance (see information below)  Ice affected area  Oral antihistamine (Benadryl or Zyrtec)  Oral anti-inflammatory (ibuprofen)  Topical corticosteroid (Hydrocortisone cream 1%)    Strategies for Safer Mosquito Avoidance  by Hale Drone   Mosquitoes are a terrible nuisance in the muggy summer months, especially now that the ferocious Asian tiger mosquito has made a permanent home here in [place]. The arrival of [place] Nile virus has added some urgency to mosquito control measures, but spray programs and many repellents may do more harm than good in the long term. Choosing the least-toxic solutions can protect both your health and comfort in mosquito season. Here are some suggestions for safer and more effective bite avoidance this summer.   Population Control  Keeping mosquito populations in check is the most important way to avoid bites. It's no secret that removing sources of standing water is crucial to eliminating mosquito breeding grounds. Common breeding sites to watch for include:  * Rain gutters. Clean them out and offer to do the same for elderly neighbors or others who may not be able to do the job themselves. Remember that mosquito control is a community-wide effort.  * Flowerpots, buckets and old tires. Be sure empty containers cannot hold water.  * Bird baths and pet dishes. Empty and clean them weekly.  * Recycling bins and the cans inside. These may harbor stagnant water if not emptied regularly.  * Rain barrels. Be sure they are sealed off from mosquitoes.  * Storm drains. Watch for clogs from branches and garbage.  Insecticide  sprays targeting adult mosquitoes can only reduce mosquito populations for a day or two. In fact, since insecticides also kill off important mosquito predators such as dragonflies, a spray program can actually be counter-productive by leaving the rebounding mosquito population without natural enemies.  Instead, interrupt the breeding cycle by using the nontoxic bacterial larvicide Bacillus thuringiensis var. israelensis (Bti). Bti is sold in convenient donuts called "mosquito dunks" that you can safely use in your bird bath, rain barrel or low areas around your yard to kill mosquito larvae before the adults emerge and spread throughout the community, where they become much harder to kill. Bti is not harmful to fish, birds or mammals, and single applications can remain effective for a month or more, even if the water source dries out and refills.   Safer Repellents  If you'll be outdoors at dawn or dusk when mosquitoes are most active, wear long clothes that don't leave skin exposed. (You may use insect repellent on your clothes). When  you do get bites, soothe them by slathering on an astringent such as witch hazel after you come inside - it will prevent scratching and allow bites to heal quickly.  Lately many public health officials concerned about [place] virus have been advising people to use repellents containing the pesticide DEET (N,N-diethyl-meta-toluamide). While DEET is an extremely effective mosquito repellent, it is also a neurotoxin, and studies have shown that prolonged frequent exposure can irritate skin, cause muscle twitching and weakness and harm the brain and nervous system, especially when combined with other pesticides such as permethrin.  Consumer studies report that [employer]'s Skin-So-Soft and herbal repellents containing citronella can be just as effective as DEET at repelling mosquitoes but need to be applied more often. The solution is to choose the safer formulas and reapply as needed.   General guidelines for using any insect repellent:  * Choose oils or lotions rather than sprays, which produce fine particles that are easily inhaled.  * Do not apply repellents to broken skin.  * Do not allow children to apply their own repellent, and do not apply repellents containing DEET or other pesticides directly to children's skin. If you use such products, they can be applied to children's clothing instead.  * Do not use sunscreen/repellent combinations. Sunscreen needs to be reapplied more often than repellents, so the combination products can result in overexposure to pesticides.  * Wash off all repellent from skin and clothing immediately after coming indoors.  Area-wide repellent strategies can also be effective for outdoor gatherings. There are various contraptions available that emit carbon dioxide to trap mosquitoes (such as the Mosquito Magnet and Mosquito Deleto). These are expensive, but they do work, and some companies will even rent them to you for an outdoor event. Citronella candles are also effective when there is no breeze, but beware of candles containing pesticides - the smoke is easily inhaled and can irritate the airway. Placing fans around your porch or patio can blow mosquitoes away.  Keep in mind that only female mosquitoes actually bite and that most mosquito species in this area do not transmit [place] Nile virus. You are most at risk of being bitten by a mosquito carrying the disease at dawn and dusk, and even in these cases your chances of actually contracting the virus are extremely low. So take sensible steps to keep the buggers under control, but also keep them in perspective as the annoyances they are.

## 2019-05-17 NOTE — Assessment & Plan Note (Signed)
   Treatment plan as outlined above for allergic rhinitis.  A prescription has been provided for Pazeo, one drop per eye daily as needed.  I have also recommended eye lubricant drops (i.e., Natural Tears) as needed. 

## 2019-05-17 NOTE — Assessment & Plan Note (Addendum)
   Aeroallergen avoidance measures have been discussed and provided in written form.  A prescription has been provided for levocetirizine(Xyzal), 5 mg daily as needed.  A prescription has been provided for azelastine nasal spray, 1-2 sprays per nostril twice daily as needed. Proper nasal spray technique has been discussed and demonstrated.  Nasal saline spray (i.e., Simply Saline) or nasal saline lavage (i.e., NeilMed) is recommended as needed and prior to medicated nasal sprays.  If allergen avoidance measures and medications fail to adequately relieve symptoms, aeroallergen immunotherapy will be considered.

## 2019-05-17 NOTE — Assessment & Plan Note (Signed)
   Continue albuterol HFA, 1 to 2 inhalations every 4-6 hours if needed.  Subjective and objective measures of pulmonary function will be followed and the treatment plan will be adjusted accordingly. 

## 2019-10-08 ENCOUNTER — Other Ambulatory Visit: Payer: Self-pay

## 2019-10-08 DIAGNOSIS — Z20822 Contact with and (suspected) exposure to covid-19: Secondary | ICD-10-CM

## 2019-10-09 LAB — NOVEL CORONAVIRUS, NAA: SARS-CoV-2, NAA: NOT DETECTED

## 2019-12-08 ENCOUNTER — Ambulatory Visit: Payer: Medicaid Other | Attending: Internal Medicine

## 2019-12-08 DIAGNOSIS — Z23 Encounter for immunization: Secondary | ICD-10-CM

## 2019-12-08 NOTE — Progress Notes (Signed)
   Covid-19 Vaccination Clinic  Name:  Sara Mendoza    MRN: [id number] DOB: 1980/08/25  12/08/2019  [name] was observed post Covid-19 immunization for 15 minutes without incident. She was provided with Vaccine Information Sheet and instruction to access the V-Safe system.   [name] was instructed to call 911 with any severe reactions post vaccine: Marland Kitchen Difficulty breathing  . Swelling of face and throat  . A fast heartbeat  . A bad rash all over body  . Dizziness and weakness   Immunizations Administered    Name Date Dose VIS Date Route   Pfizer COVID-19 Vaccine 12/08/2019  4:51 PM 0.3 mL 10/11/2018 Intramuscular   Manufacturer: [employer], [employer]   Lot: [id number]   NDC: [address]-0

## 2020-01-01 ENCOUNTER — Ambulatory Visit: Payer: Medicaid Other | Attending: Internal Medicine

## 2020-01-01 DIAGNOSIS — Z23 Encounter for immunization: Secondary | ICD-10-CM

## 2020-01-01 NOTE — Progress Notes (Signed)
   Covid-19 Vaccination Clinic  Name:  Meshelle Holness    MRN: [id number] DOB: 1981/05/13  01/01/2020  Ms. Duddy was observed post Covid-19 immunization for 15 minutes without incident. She was provided with Vaccine Information Sheet and instruction to access the V-Safe system.   Ms. Rosamilia was instructed to call 911 with any severe reactions post vaccine: Marland Kitchen Difficulty breathing  . Swelling of face and throat  . A fast heartbeat  . A bad rash all over body  . Dizziness and weakness   Immunizations Administered    Name Date Dose VIS Date Route   Pfizer COVID-19 Vaccine 01/01/2020  4:43 PM 0.3 mL 10/11/2018 Intramuscular   Manufacturer: [employer], [employer]   Lot: [id number]   NDC: [address]-8

## 2020-03-13 ENCOUNTER — Other Ambulatory Visit: Payer: Self-pay

## 2020-03-13 ENCOUNTER — Emergency Department (HOSPITAL_COMMUNITY)
Admission: EM | Admit: 2020-03-13 | Discharge: 2020-03-13 | Disposition: A | Discharge status: Home / Self Care | Payer: Medicaid Other | Attending: Emergency Medicine | Admitting: Emergency Medicine

## 2020-03-13 ENCOUNTER — Encounter (HOSPITAL_COMMUNITY): Payer: Self-pay | Admitting: Emergency Medicine

## 2020-03-13 DIAGNOSIS — R45851 Suicidal ideations: Secondary | ICD-10-CM | POA: Diagnosis not present

## 2020-03-13 DIAGNOSIS — Z20822 Contact with and (suspected) exposure to covid-19: Secondary | ICD-10-CM | POA: Insufficient documentation

## 2020-03-13 DIAGNOSIS — F17200 Nicotine dependence, unspecified, uncomplicated: Secondary | ICD-10-CM | POA: Insufficient documentation

## 2020-03-13 DIAGNOSIS — Z79899 Other long term (current) drug therapy: Secondary | ICD-10-CM | POA: Diagnosis not present

## 2020-03-13 DIAGNOSIS — Y907 Blood alcohol level of 200-239 mg/100 ml: Secondary | ICD-10-CM | POA: Diagnosis not present

## 2020-03-13 DIAGNOSIS — F10129 Alcohol abuse with intoxication, unspecified: Secondary | ICD-10-CM | POA: Insufficient documentation

## 2020-03-13 DIAGNOSIS — J45909 Unspecified asthma, uncomplicated: Secondary | ICD-10-CM | POA: Insufficient documentation

## 2020-03-13 DIAGNOSIS — F10920 Alcohol use, unspecified with intoxication, uncomplicated: Secondary | ICD-10-CM

## 2020-03-13 DIAGNOSIS — R4689 Other symptoms and signs involving appearance and behavior: Secondary | ICD-10-CM

## 2020-03-13 LAB — CBC WITH DIFFERENTIAL/PLATELET
Abs Immature Granulocytes: 0.05 10*3/uL (ref 0.00–0.07)
Basophils Absolute: 0.1 10*3/uL (ref 0.0–0.1)
Basophils Relative: 0 %
Eosinophils Absolute: 0.1 10*3/uL (ref 0.0–0.5)
Eosinophils Relative: 1 %
HCT: 41.8 % (ref 36.0–46.0)
Hemoglobin: 13.8 g/dL (ref 12.0–15.0)
Immature Granulocytes: 0 %
Lymphocytes Relative: 21 %
Lymphs Abs: 2.9 10*3/uL (ref 0.7–4.0)
MCH: 29.6 pg (ref 26.0–34.0)
MCHC: 33 g/dL (ref 30.0–36.0)
MCV: 89.5 fL (ref 80.0–100.0)
Monocytes Absolute: 0.7 10*3/uL (ref 0.1–1.0)
Monocytes Relative: 5 %
Neutro Abs: 10.4 10*3/uL — ABNORMAL HIGH (ref 1.7–7.7)
Neutrophils Relative %: 73 %
Platelets: 321 10*3/uL (ref 150–400)
RBC: 4.67 MIL/uL (ref 3.87–5.11)
RDW: 13.2 % (ref 11.5–15.5)
WBC: 14.1 10*3/uL — ABNORMAL HIGH (ref 4.0–10.5)
nRBC: 0 % (ref 0.0–0.2)

## 2020-03-13 LAB — COMPREHENSIVE METABOLIC PANEL
ALT: 29 U/L (ref 0–44)
AST: 28 U/L (ref 15–41)
Albumin: 4.6 g/dL (ref 3.5–5.0)
Alkaline Phosphatase: 78 U/L (ref 38–126)
Anion gap: 12 (ref 5–15)
BUN: 15 mg/dL (ref 6–20)
CO2: 20 mmol/L — ABNORMAL LOW (ref 22–32)
Calcium: 8.9 mg/dL (ref 8.9–10.3)
Chloride: 106 mmol/L (ref 98–111)
Creatinine, Ser: 0.62 mg/dL (ref 0.44–1.00)
GFR calc Af Amer: >60 mL/min (ref >60)
GFR calc non Af Amer: >60 mL/min (ref >60)
Glucose, Bld: 121 mg/dL — ABNORMAL HIGH (ref 70–99)
Potassium: 4.2 mmol/L (ref 3.5–5.1)
Sodium: 138 mmol/L (ref 135–145)
Total Bilirubin: 0.5 mg/dL (ref 0.3–1.2)
Total Protein: 8.3 g/dL — ABNORMAL HIGH (ref 6.5–8.1)

## 2020-03-13 LAB — HCG, QUANTITATIVE, PREGNANCY: hCG, Beta Chain, Quant, S: <1 m[IU]/mL (ref <5)

## 2020-03-13 LAB — ETHANOL: Alcohol, Ethyl (B): 229 mg/dL — ABNORMAL HIGH (ref <10)

## 2020-03-13 LAB — RAPID URINE DRUG SCREEN, HOSP PERFORMED
Amphetamines: NOT DETECTED
Barbiturates: NOT DETECTED
Benzodiazepines: NOT DETECTED
Cocaine: NOT DETECTED
Opiates: NOT DETECTED
Tetrahydrocannabinol: NOT DETECTED

## 2020-03-13 LAB — SARS CORONAVIRUS 2 BY RT PCR (HOSPITAL ORDER, PERFORMED IN ~~LOC~~ HOSPITAL LAB): SARS Coronavirus 2: NEGATIVE

## 2020-03-13 LAB — ACETAMINOPHEN LEVEL: Acetaminophen (Tylenol), Serum: <10 ug/mL — ABNORMAL LOW (ref 10–30)

## 2020-03-13 LAB — SALICYLATE LEVEL: Salicylate Lvl: <7 mg/dL — ABNORMAL LOW (ref 7.0–30.0)

## 2020-03-13 MED ORDER — ONDANSETRON HCL 4 MG PO TABS: 4.0000 mg | ORAL_TABLET | Freq: Three times a day (TID) | ORAL | Status: DC | PRN

## 2020-03-13 MED ORDER — ALUM & MAG HYDROXIDE-SIMETH 200-200-20 MG/5ML PO SUSP: 30.0000 mL | Freq: Four times a day (QID) | ORAL | Status: DC | PRN
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
## 2020-03-13 NOTE — ED Notes (Signed)
Pt assisted to use phone, pt remains calm and cooperative at this time.
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
## 2020-03-13 NOTE — Patient Outreach (Signed)
ED Peer Support Specialist Patient Intake (Complete at intake & 30-60 Day Follow-up)  Name: Sara Mendoza  MRN: [id number]  Age: 39 y.o.   Date of Admission: 03/13/2020  Intake: Initial Comments:      Primary Reason Admitted: Suicidal   Lab values: Alcohol/ETOH: Positive Positive UDS? No Amphetamines: No Barbiturates: No Benzodiazepines: No Cocaine: No Opiates: No Cannabinoids: No  Demographic information: Gender: Female Ethnicity: Other (comment) Marital Status: Married Insurance Status: Medicaid Control and instrumentation engineer (Work Engineer, agricultural, Sales executive, etc.: No Lives with: Partner/Spouse Living situation: House/Apartment  Reported Patient History: Patient reported health conditions: None Patient aware of HIV and hepatitis status: No  In past year, has patient visited ED for any reason? Yes  Number of ED visits: 2  Reason(s) for visit: various reasons  In past year, has patient been hospitalized for any reason? No  Number of hospitalizations:    Reason(s) for hospitalization:    In past year, has patient been arrested? No  Number of arrests:    Reason(s) for arrest:    In past year, has patient been incarcerated? No  Number of incarcerations:    Reason(s) for incarceration:    In past year, has patient received medication-assisted treatment? No  In past year, patient received the following treatments: Other (comment)  In past year, has patient received any harm reduction services? No  Did this include any of the following?    In past year, has patient received care from a mental health provider for diagnosis other than SUD? No  In past year, is this first time patient has overdosed? No  Number of past overdoses:    In past year, is this first time patient has been hospitalized for an overdose? No  Number of hospitalizations for overdose(s):    Is patient currently receiving treatment for a mental health diagnosis?  No  Patient reports experiencing difficulty participating in SUD treatment: No    Most important reason(s) for this difficulty?    Has patient received prior services for treatment? No  In past, patient has received services from following agencies:    Plan of Care:  Suggested follow up at these agencies/treatment centers: Other (comment)  Other information: Pt stated that she is doing fine an that she does not have any issues to address other than the fact that she had to much to drink an that she was alright. Pt stated that her husband was concern about her an he placed her in the hospital. Pt stated that she wanted resources for AA meetings an possible information about outpatient services.  [facility] left contact information for Pt to follow up with [facility] if needs assistance in community.    Arlys John Carmaleta Youngers, [facility]  03/13/2020 1:40 PM

## 2020-03-13 NOTE — BH Assessment (Signed)
BHH Assessment Progress Note  Per Marciano Sequin, NP, this pt does not require psychiatric hospitalization at this time.  Pt presents under IVC initiated by her husband and rescinded by EDP Benjiman Core, MD.  Pt is to be discharged from [facility] with referral information for area substance abuse treatment providers.  This has been included in pt's discharge instructions.  Pt would also benefit from seeing Peer Support Specialists, and a peer support consult has been ordered for pt.  Pt's nurse has been notified.  Doylene Canning, MA Triage Specialist [phone]

## 2020-03-13 NOTE — Discharge Instructions (Signed)
To help you maintain a sober lifestyle, a substance abuse treatment program may be beneficial to you.  [facility] offers substance abuse counseling.  Contact them at your earliest opportunity to ask about enrolling in their program: ° °     [facility] °     [address] °     [place], [place] [address] °     [phone] °     New patients are seen at their walk-in clinic.  Walk-in hours are Monday - Friday from 8:30 am - 12:00 pm, and from 1:00 pm - 2:30 pm.  Walk-in patients are seen on a first come, first served basis, so try to arrive as early as possible for the best chance of being seen the same day. °

## 2020-03-13 NOTE — ED Notes (Addendum)
Discharge paperwork reviewed with pt.  Pt belongings returned to pt.  Pt with no questions or concerns at this time.
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
## 2020-03-13 NOTE — ED Notes (Signed)
Pt cooperative, tearful at this time. Pt dressed out in purple scrubs, sitter at bedside.

## 2021-09-15 ENCOUNTER — Encounter (HOSPITAL_BASED_OUTPATIENT_CLINIC_OR_DEPARTMENT_OTHER): Payer: Self-pay

## 2021-09-15 ENCOUNTER — Other Ambulatory Visit: Payer: Self-pay

## 2021-09-15 ENCOUNTER — Emergency Department (HOSPITAL_BASED_OUTPATIENT_CLINIC_OR_DEPARTMENT_OTHER)
Admission: EM | Admit: 2021-09-15 | Discharge: 2021-09-15 | Disposition: A | Discharge status: Home / Self Care | Payer: Worker's Compensation | Attending: Emergency Medicine | Admitting: Emergency Medicine

## 2021-09-15 ENCOUNTER — Emergency Department (HOSPITAL_BASED_OUTPATIENT_CLINIC_OR_DEPARTMENT_OTHER): Payer: Medicaid Other

## 2021-09-15 DIAGNOSIS — X500XXA Overexertion from strenuous movement or load, initial encounter: Secondary | ICD-10-CM | POA: Insufficient documentation

## 2021-09-15 DIAGNOSIS — Y990 Civilian activity done for income or pay: Secondary | ICD-10-CM | POA: Diagnosis not present

## 2021-09-15 DIAGNOSIS — S39012A Strain of muscle, fascia and tendon of lower back, initial encounter: Secondary | ICD-10-CM | POA: Insufficient documentation

## 2021-09-15 DIAGNOSIS — S34109A Unspecified injury to unspecified level of lumbar spinal cord, initial encounter: Secondary | ICD-10-CM | POA: Diagnosis present

## 2021-09-15 LAB — URINALYSIS, ROUTINE W REFLEX MICROSCOPIC
Bilirubin Urine: NEGATIVE
Glucose, UA: NEGATIVE mg/dL
Ketones, ur: NEGATIVE mg/dL
Leukocytes,Ua: NEGATIVE
Nitrite: NEGATIVE
Protein, ur: NEGATIVE mg/dL
Specific Gravity, Urine: 1.025 (ref 1.005–1.030)
pH: 6 (ref 5.0–8.0)

## 2021-09-15 LAB — PREGNANCY, URINE: Preg Test, Ur: NEGATIVE

## 2021-09-15 LAB — URINALYSIS, MICROSCOPIC (REFLEX)

## 2021-09-15 MED ORDER — METHOCARBAMOL 500 MG PO TABS: 500.0000 mg | ORAL_TABLET | Freq: Two times a day (BID) | ORAL | 0 refills | Status: AC

## 2021-09-15 MED ORDER — NAPROXEN 250 MG PO TABS
500.0000 mg | ORAL_TABLET | Freq: Once | ORAL | Status: AC
  Administered 2021-09-15: 500 mg via ORAL
  Filled 2021-09-15: qty 2

## 2021-09-15 MED ORDER — NAPROXEN 500 MG PO TABS: 500.0000 mg | ORAL_TABLET | Freq: Two times a day (BID) | ORAL | 0 refills | Status: AC

## 2021-09-15 MED ORDER — LIDOCAINE 5 % EX PTCH: 1.0000 | MEDICATED_PATCH | CUTANEOUS | 0 refills | Status: AC

## 2021-09-15 NOTE — ED Provider Notes (Signed)
[facility] EMERGENCY DEPARTMENT Provider Note   CSN: [id number] Arrival date & time: 09/15/21  1419     History  Chief Complaint  Patient presents with   Back Pain    Sara Mendoza is a 41 y.o. female presenting with left lower back pain.  Patient works as a Optician, dispensing and was trying to lift somewhat onto the [employer] and threw out her back 3 weeks ago.  Denies any numbness or tingling.  Still ambulatory.  Reports she has been using Tylenol which is no longer helping her.  Pain is worsening.  Says that she needs to be evaluated for [employer].  Back Pain     Home Medications Prior to Admission medications   Medication Sig Start Date End Date Taking? Authorizing Provider  acetaminophen (TYLENOL) 500 MG tablet Take 500 mg by mouth every 6 (six) hours as needed for mild pain.     [provider]  albuterol (VENTOLIN HFA) 108 (90 Base) MCG/ACT inhaler Inhale 2 puffs into the lungs every 4 (four) hours as needed for wheezing or shortness of breath.  04/13/19   [provider]  cetirizine (ZYRTEC) 10 MG tablet Take 10 mg by mouth daily as needed for allergies.    [provider]  EPINEPHrine (AUVI-Q) 0.3 mg/0.3 mL IJ SOAJ injection Use as directed for severe allergic reactions 05/17/19   Bobbitt, Heywood Iles, MD  ibuprofen (ADVIL) 800 MG tablet Take 800 mg by mouth every 8 (eight) hours as needed for moderate pain.  02/24/16   [provider]  azelastine (ASTELIN) 0.1 % nasal spray 1-2 sprays per nostril twice daily as needed. Patient not taking: Reported on 03/13/2020 05/17/19 03/13/20  Bobbitt, Heywood Iles, MD  levocetirizine Elita Boone) 5 MG tablet Take 1 tablet once daily if needed for runny nose Patient not taking: Reported on 03/13/2020 05/17/19 03/13/20  Bobbitt, Heywood Iles, MD      Allergies    Venomil mixed vespid [mixed vespid venom]    Review of Systems   Review of Systems  Musculoskeletal:  Positive for back pain.    Physical Exam Updated Vital Signs BP (!) 155/94 (BP Location: Left Arm)    Pulse (!) 110    Temp 99.1 F (37.3 C) (Oral)    Resp 18    Ht 5\' 9"  (1.753 m)    Wt 94.3 kg    LMP 09/13/2021    SpO2 100%    BMI 30.72 kg/m  Physical Exam Vitals and nursing note reviewed.  Constitutional:      General: She is not in acute distress.    Appearance: Normal appearance. She is not ill-appearing.  HENT:     Head: Normocephalic and atraumatic.  Eyes:     General: No scleral icterus.    Conjunctiva/sclera: Conjunctivae normal.  Pulmonary:     Effort: Pulmonary effort is normal. No respiratory distress.  Musculoskeletal:        General: Tenderness (Left lower paraspinal muscles of lumbar spine) present. No swelling. Normal range of motion.     Comments: Patient with full range of motion of all levels of the spine.  Increased pain with bilateral rotation and flexion.  Skin:    General: Skin is warm and dry.     Findings: No rash.  Neurological:     Mental Status: She is alert.     Gait: Gait normal.  Psychiatric:        Mood and Affect: Mood normal.  ED Results / Procedures / Treatments   Labs (all labs ordered are listed, but only abnormal results are displayed) Labs Reviewed  URINALYSIS, ROUTINE W REFLEX MICROSCOPIC - Abnormal; Notable for the following components:      Result Value   Hgb urine dipstick SMALL (*)    All other components within normal limits  URINALYSIS, MICROSCOPIC (REFLEX) - Abnormal; Notable for the following components:   Bacteria, UA RARE (*)    All other components within normal limits  PREGNANCY, URINE    EKG None  Radiology DG Lumbar Spine Complete  Result Date: 09/15/2021 CLINICAL DATA:  Lower back injury at work 3 weeks ago.  Pain. EXAM: LUMBAR SPINE - COMPLETE 4+ VIEW COMPARISON:  None. FINDINGS: There is no evidence of lumbar spine fracture. Alignment is normal. Mild multilevel degenerate disc disease with disc space narrowing and minimal marginal  osteophytes. Mild facet joint arthropathy at L5-S1. IMPRESSION: No acute fracture or subluxation. 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 Electronically Signed   By: Larose Hires D.O.   On: 09/15/2021 16:48    Procedures Procedures   Medications Ordered in ED Medications - No data to display  ED Course/ Medical Decision Making/ A&P                           Medical Decision Making Amount and/or Complexity of Data Reviewed Labs: ordered. Radiology: ordered.  Risk Prescription drug management.  41 year old female presenting today due to back pain.  This has been going on since her injury 3 weeks ago.  Testing: Triage nursing ordered labs and urine.  These were within normal limits per my review.  Imaging: X-ray of lumbar spine ordered.  Individually interpreted and reviewed by myself and agree that there is no dislocation or fracture.  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  Patient was originally tachycardic.  She reports that this was secondary to pain.  I reevaluated this and got a heart rate in the low 90s.  Patient to be discharged at this time with lidocaine patches, naproxen and Robaxin.   Final Clinical Impression(s) / ED Diagnoses Final diagnoses:  Strain of lumbar region, initial encounter    Rx / DC Orders Results and diagnoses were explained to the patient. Return precautions discussed in full. Patient had no additional questions and expressed complete understanding.   This chart was dictated using voice recognition software.  Despite best efforts to proofread,  errors can occur which can change the documentation meaning.    Saddie Benders, PA-C 09/15/21 1733    Pricilla Loveless, MD 09/16/21 [phone]

## 2021-09-15 NOTE — Discharge Instructions (Addendum)
The x-ray of your back is normal today.  There are are some signs of degeneration however this is expected with aging and a job where you are doing lots of lifting.  Muscle relaxants, lidocaine patches and naproxen have been sent to your pharmacy.

## 2021-09-15 NOTE — ED Triage Notes (Signed)
Pt c/o lower back injury at work 3 weeks ago-NAD-slow gait

## 2024-01-18 ENCOUNTER — Emergency Department (HOSPITAL_COMMUNITY)
Admission: EM | Admit: 2024-01-18 | Discharge: 2024-01-18 | Disposition: A | Discharge status: Home / Self Care | Attending: Emergency Medicine | Admitting: Emergency Medicine

## 2024-01-18 ENCOUNTER — Emergency Department (HOSPITAL_COMMUNITY)

## 2024-01-18 ENCOUNTER — Other Ambulatory Visit: Payer: Self-pay

## 2024-01-18 DIAGNOSIS — W01110A Fall on same level from slipping, tripping and stumbling with subsequent striking against sharp glass, initial encounter: Secondary | ICD-10-CM | POA: Insufficient documentation

## 2024-01-18 DIAGNOSIS — S41111A Laceration without foreign body of right upper arm, initial encounter: Secondary | ICD-10-CM | POA: Diagnosis not present

## 2024-01-18 DIAGNOSIS — S4991XA Unspecified injury of right shoulder and upper arm, initial encounter: Secondary | ICD-10-CM | POA: Diagnosis present

## 2024-01-18 MED ORDER — LIDOCAINE-EPINEPHRINE (PF) 2 %-1:200000 IJ SOLN
10.0000 mL | Freq: Once | INTRAMUSCULAR | Status: DC
  Filled 2024-01-18: qty 20

## 2024-01-18 MED ORDER — CEPHALEXIN 500 MG PO CAPS
500.0000 mg | ORAL_CAPSULE | Freq: Four times a day (QID) | ORAL | 0 refills | Status: AC
Start: 2024-01-18 — End: ?

## 2024-01-18 NOTE — ED Provider Notes (Signed)
 [place] [facility] AT [facility] Provider Note   CSN: [id number] Arrival date & time: 01/18/24  7425     History  Chief Complaint  Patient presents with   Fall   Arm Injury    Sara Mendoza is a 43 y.o. female.  43 yo F with a chief complaints of falling into a glass table.  She apparently was intoxicated last night and she tripped and fell.  Hurt her right arm.  She said she rinsed it out and dressed it.  Came here for evaluation.  She endorses that her tetanus is up-to-date.   Fall  Arm Injury      Home Medications Prior to Admission medications   Medication Sig Start Date End Date Taking? Authorizing Provider  cephALEXin (KEFLEX) 500 MG capsule Take 1 capsule (500 mg total) by mouth 4 (four) times daily. 01/18/24  Yes Juanetta Nordmann, PA  acetaminophen  (TYLENOL ) 500 MG tablet Take 500 mg by mouth every 6 (six) hours as needed for mild pain.     [provider]  albuterol (VENTOLIN HFA) 108 (90 Base) MCG/ACT inhaler Inhale 2 puffs into the lungs every 4 (four) hours as needed for wheezing or shortness of breath.  04/13/19   [provider]  cetirizine (ZYRTEC) 10 MG tablet Take 10 mg by mouth daily as needed for allergies.    [provider]  EPINEPHrine  (AUVI-Q ) 0.3 mg/0.3 mL IJ SOAJ injection Use as directed for severe allergic reactions 05/17/19   Bobbitt, Colen Daunt, MD  ibuprofen (ADVIL) 800 MG tablet Take 800 mg by mouth every 8 (eight) hours as needed for moderate pain.  02/24/16   [provider]  lidocaine  (LIDODERM ) 5 % Place 1 patch onto the skin daily. Remove & Discard patch within 12 hours or as directed by MD 09/15/21   Redwine, Madison A, PA-C  methocarbamol  (ROBAXIN ) 500 MG tablet Take 1 tablet (500 mg total) by mouth 2 (two) times daily. 09/15/21   Redwine, Madison A, PA-C  naproxen  (NAPROSYN ) 500 MG tablet Take 1 tablet (500 mg total) by mouth 2 (two) times daily. 09/15/21   Redwine, Madison A,  PA-C  azelastine  (ASTELIN ) 0.1 % nasal spray 1-2 sprays per nostril twice daily as needed. Patient not taking: Reported on 03/13/2020 05/17/19 03/13/20  Bobbitt, Colen Daunt, MD  levocetirizine (XYZAL ) 5 MG tablet Take 1 tablet once daily if needed for runny nose Patient not taking: Reported on 03/13/2020 05/17/19 03/13/20  Bobbitt, Colen Daunt, MD      Allergies    Venomil mixed vespid [mixed vespid venom]    Review of Systems   Review of Systems  Physical Exam Updated Vital Signs BP 118/88   Pulse 81   Temp 99.4 F (37.4 C) (Oral)   Resp 18   LMP 12/30/2023 (Exact Date)   SpO2 100%  Physical Exam Vitals and nursing note reviewed.  Constitutional:      General: She is not in acute distress.    Appearance: She is well-developed. She is not diaphoretic.  HENT:     Head: Normocephalic and atraumatic.  Eyes:     Pupils: Pupils are equal, round, and reactive to light.  Cardiovascular:     Rate and Rhythm: Normal rate and regular rhythm.     Heart sounds: No murmur heard.    No friction rub. No gallop.  Pulmonary:     Effort: Pulmonary effort is normal.     Breath sounds: No wheezing or rales.  Abdominal:     General: There is no distension.     Palpations: Abdomen is soft.     Tenderness: There is no abdominal tenderness.  Musculoskeletal:        General: Swelling and tenderness present.     Cervical back: Normal range of motion and neck supple.     Comments: Multiple linear lacs to the right arm.   Skin:    General: Skin is warm and dry.  Neurological:     Mental Status: She is alert and oriented to person, place, and time.  Psychiatric:        Behavior: Behavior normal.     ED Results / Procedures / Treatments   Labs (all labs ordered are listed, but only abnormal results are displayed) Labs Reviewed - No data to display  EKG None  Radiology DG Wrist Complete Right Result Date: 01/18/2024 CLINICAL DATA:  Foreign EXAM: RIGHT WRIST - COMPLETE 3+ VIEW  COMPARISON:  None Available. FINDINGS: There is no evidence of fracture or dislocation. There is no evidence of arthropathy or other focal bone abnormality. Soft tissues are unremarkable. Prior ORIF distal radius, near anatomic alignment. No opaque foreign bodies IMPRESSION: No acute findings. Mild soft tissue swelling dorsum of the distal forearm. Electronically Signed   By: Fredrich Jefferson M.D.   On: 01/18/2024 09:48   DG Forearm Right Result Date: 01/18/2024 CLINICAL DATA:  Foreign body EXAM: RIGHT FOREARM - 2 VIEW COMPARISON:  None Available. FINDINGS: There is no evidence of fracture or other focal bone lesions. Soft tissues are unremarkable. Prior ORIF distal radius, near anatomic alignment IMPRESSION: Negative.  No opaque foreign body Electronically Signed   By: Fredrich Jefferson M.D.   On: 01/18/2024 09:48   DG Elbow 2 Views Right Result Date: 01/18/2024 CLINICAL DATA:  Foreign body in soft tissues of the elbow EXAM: RIGHT ELBOW - 2 VIEW COMPARISON:  None Available. FINDINGS: There is no evidence of fracture, dislocation, or joint effusion. There is no evidence of arthropathy or other focal bone abnormality. Soft tissues are unremarkable. IMPRESSION: Negative.  No opaque foreign bodies Electronically Signed   By: Fredrich Jefferson M.D.   On: 01/18/2024 09:48    Procedures Procedures    Medications Ordered in ED Medications  lidocaine -EPINEPHrine  (XYLOCAINE  W/EPI) 2 %-1:200000 (PF) injection 10 mL (has no administration in time range)    ED Course/ Medical Decision Making/ A&P                                 Medical Decision Making Amount and/or Complexity of Data Reviewed Radiology: ordered.  Risk Prescription drug management.   43 yo F with a cc of multiple lacerations to the right arm.  Will obtain plain films to assess for foreign body.   Plain film on my independent interpretation without fracture or foreign body.  Wound was sutured up at bedside.  Please see PA note for details.   Will discharge home.  PCP follow-up.  11:11 AM:  I have discussed the diagnosis/risks/treatment options with the patient.  Evaluation and diagnostic testing in the emergency department does not suggest an emergent condition requiring admission or immediate intervention beyond what has been performed at this time.  They will follow up with PCP. We also discussed returning to the ED immediately if new or worsening sx occur. We discussed the sx which are most concerning (e.g., sudden worsening pain, fever, inability to tolerate by  mouth) that necessitate immediate return. Medications administered to the patient during their visit and any new prescriptions provided to the patient are listed below.  Medications given during this visit Medications  lidocaine -EPINEPHrine  (XYLOCAINE  W/EPI) 2 %-1:200000 (PF) injection 10 mL (has no administration in time range)     The patient appears reasonably screen and/or stabilized for discharge and I doubt any other medical condition or other [facility] requiring further screening, evaluation, or treatment in the ED at this time prior to discharge.            Final Clinical Impression(s) / ED Diagnoses Final diagnoses:  Laceration of right upper extremity, initial encounter    Rx / DC Orders ED Discharge Orders          Ordered    cephALEXin (KEFLEX) 500 MG capsule  4 times daily        01/18/24 1043              Albertus Hughs, DO 01/18/24 1111

## 2024-01-18 NOTE — ED Provider Notes (Signed)
.  Laceration Repair  Date/Time: 01/18/2024 10:38 AM  Performed by: Juanetta Nordmann, PA Authorized by: Juanetta Nordmann, PA   Consent:    Consent obtained:  Verbal   Consent given by:  Patient   Risks, benefits, and alternatives were discussed: yes     Risks discussed:  Infection, pain, need for additional repair, poor cosmetic result, poor wound healing, vascular damage and retained foreign body   Alternatives discussed:  No treatment, observation, referral and delayed treatment Universal protocol:    Procedure explained and questions answered to patient or proxy's satisfaction: yes     Patient identity confirmed:  Verbally with patient, arm band and hospital-assigned identification number Anesthesia:    Anesthesia method:  Local infiltration   Local anesthetic:  Lidocaine  2% WITH epi Laceration details:    Location:  Shoulder/arm   Shoulder/arm location:  R lower arm   Length (cm):  6   Depth (mm):  2 Pre-procedure details:    Preparation:  Patient was prepped and draped in usual sterile fashion Exploration:    Limited defect created (wound extended): no     Hemostasis achieved with:  Direct pressure and epinephrine    Imaging obtained: x-ray     Imaging outcome: foreign body not noted     Wound exploration: wound explored through full range of motion and entire depth of wound visualized     Wound extent: areolar tissue not violated, fascia not violated, no foreign body, no signs of injury, no nerve damage, no tendon damage, no underlying fracture and no vascular damage     Contaminated: no   Treatment:    Area cleansed with:  Shur-Clens   Amount of cleaning:  Standard   Irrigation solution:  Sterile saline   Irrigation volume:  200   Irrigation method:  Syringe   Debridement:  None   Undermining:  None   Scar revision: no   Skin repair:    Repair method:  Sutures   Suture size:  4-0   Suture material:  Prolene   Suture technique:  Horizontal mattress and simple  interrupted   Number of sutures:  9 Approximation:    Approximation:  Close Repair type:    Repair type:  Simple Post-procedure details:    Dressing:  Antibiotic ointment, bulky dressing and non-adherent dressing   Procedure completion:  Tolerated well, no immediate complications Comments:     And primary wound, placed 3 simple interrupted sutures along with 3 horizontal mattress sutures.  And secondary wound, placed 3 simple interrupted sutures.     Juanetta Nordmann, PA 01/18/24 1041    Albertus Hughs, DO 01/18/24 [phone]

## 2024-01-18 NOTE — ED Triage Notes (Signed)
 Pt reports tripping and falling and landing on glass table. Pt has redness to right side of head and has multiple lacerations to right arm from the glass. Bleeding is controlled. Pt unsure of last tetanus. Pt reports ETOH tonight

## 2024-01-18 NOTE — ED Notes (Signed)
 Patient states she is in pain and would like some meds. Triage nurse notified
